# Patient Record
Sex: Female | Born: 1975 | Race: Black or African American | Hispanic: No | Marital: Single | Smoking: Current every day smoker
Health system: Southern US, Community
[De-identification: ages and names within clinical notes are randomized; demographics above are authoritative.]

## PROBLEM LIST (undated history)

## (undated) DIAGNOSIS — M1990 Unspecified osteoarthritis, unspecified site: Secondary | ICD-10-CM

---

## 2018-02-01 ENCOUNTER — Emergency Department (HOSPITAL_COMMUNITY): Payer: No Typology Code available for payment source

## 2018-02-01 ENCOUNTER — Other Ambulatory Visit: Payer: Self-pay

## 2018-02-01 ENCOUNTER — Emergency Department (HOSPITAL_COMMUNITY)
Admission: EM | Admit: 2018-02-01 | Discharge: 2018-02-01 | Disposition: A | Discharge status: Home / Self Care | Payer: No Typology Code available for payment source | Attending: Emergency Medicine | Admitting: Emergency Medicine

## 2018-02-01 ENCOUNTER — Encounter (HOSPITAL_COMMUNITY): Payer: Self-pay

## 2018-02-01 DIAGNOSIS — Y92414 Local residential or business street as the place of occurrence of the external cause: Secondary | ICD-10-CM | POA: Insufficient documentation

## 2018-02-01 DIAGNOSIS — Y9301 Activity, walking, marching and hiking: Secondary | ICD-10-CM | POA: Insufficient documentation

## 2018-02-01 DIAGNOSIS — S7012XA Contusion of left thigh, initial encounter: Secondary | ICD-10-CM | POA: Diagnosis not present

## 2018-02-01 DIAGNOSIS — S79922A Unspecified injury of left thigh, initial encounter: Secondary | ICD-10-CM | POA: Diagnosis present

## 2018-02-01 DIAGNOSIS — Z590 Homelessness: Secondary | ICD-10-CM | POA: Insufficient documentation

## 2018-02-01 DIAGNOSIS — Y999 Unspecified external cause status: Secondary | ICD-10-CM | POA: Diagnosis not present

## 2018-02-01 DIAGNOSIS — M25552 Pain in left hip: Secondary | ICD-10-CM | POA: Diagnosis not present

## 2018-02-01 HISTORY — DX: Unspecified osteoarthritis, unspecified site: M19.90

## 2018-02-01 LAB — I-STAT BETA HCG BLOOD, ED (MC, WL, AP ONLY): I-stat hCG, quantitative: <5 m[IU]/mL (ref <5)

## 2018-02-01 MED ORDER — IBUPROFEN 200 MG PO TABS
600.0000 mg | ORAL_TABLET | Freq: Once | ORAL | Status: AC
  Administered 2018-02-01: 600 mg via ORAL
  Filled 2018-02-01: qty 1

## 2018-02-01 MED ORDER — IBUPROFEN 800 MG PO TABS: 800.0000 mg | ORAL_TABLET | Freq: Three times a day (TID) | ORAL | 0 refills | Status: DC | PRN

## 2018-02-01 NOTE — ED Notes (Signed)
Pt taken to xray 
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
## 2018-02-01 NOTE — ED Notes (Signed)
Pt back from X-ray.  
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
## 2018-02-01 NOTE — Progress Notes (Signed)
Orthopedic Tech Progress Note Patient Details:  Kathleen Burgess May 14, 1976 [id number]  Ortho Devices Type of Ortho Device: Crutches Ortho Device/Splint Interventions: Ordered, Application, Adjustment   Post Interventions Patient Tolerated: Well Instructions Provided: Care of device, Adjustment of device   Trinna PostMartinez, Uzziah Rigg J 02/01/2018, 11:47 PM

## 2018-02-01 NOTE — ED Notes (Signed)
Pt moved into PodA room to change into scrub pants.

## 2018-02-01 NOTE — ED Notes (Signed)
Case Worker at bedside

## 2018-02-01 NOTE — ED Notes (Signed)
Ortho paged for crutches 
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
## 2018-02-01 NOTE — ED Notes (Signed)
Pt verbalized understanding of d/c instructions and crutch use and has no further questions. Pt wheeled to waiting room by this Rn to wait for bus in the AM. Social work provided pt with homeless shelter information and resources throughout [place] area as well as 2 bus passes. VSS, NAD.
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
## 2018-02-02 MED FILL — IBUPROFEN 800 MG TAB: 800 | 7 days supply | Qty: 21 | Fill #0

## 2018-03-11 ENCOUNTER — Encounter (HOSPITAL_COMMUNITY): Payer: Self-pay | Admitting: Emergency Medicine

## 2018-03-11 ENCOUNTER — Ambulatory Visit (HOSPITAL_COMMUNITY)
Admission: EM | Admit: 2018-03-11 | Discharge: 2018-03-11 | Disposition: A | Discharge status: Home / Self Care | Payer: Medicare Other | Attending: Internal Medicine | Admitting: Internal Medicine

## 2018-03-11 DIAGNOSIS — M25552 Pain in left hip: Secondary | ICD-10-CM

## 2018-03-11 DIAGNOSIS — M25562 Pain in left knee: Secondary | ICD-10-CM

## 2018-03-11 MED ORDER — KETOROLAC TROMETHAMINE 30 MG/ML IJ SOLN
INTRAMUSCULAR | Status: AC
Start: 2018-03-11 — End: ?
  Filled 2018-03-11: qty 1

## 2018-03-11 MED ORDER — MELOXICAM 7.5 MG PO TABS: 7.5000 mg | ORAL_TABLET | Freq: Every day | ORAL | 0 refills | Status: AC

## 2018-03-11 MED ORDER — KETOROLAC TROMETHAMINE 30 MG/ML IJ SOLN
30.0000 mg | Freq: Once | INTRAMUSCULAR | Status: AC
  Administered 2018-03-11: 30 mg via INTRAMUSCULAR
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
## 2018-03-11 NOTE — Discharge Instructions (Signed)
Toradol injection in office today.  I have refilled her Mobic for 30 days.  Start taking today as directed.  Ice compress to the hip and knees.  Follow-up with PCP for further evaluation and management.  If experiencing numbness and tingling of the inner thigh, loss of bladder or bowel control, go to the emergency department for further evaluation.
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
## 2018-04-02 ENCOUNTER — Other Ambulatory Visit: Payer: Self-pay

## 2018-04-02 ENCOUNTER — Encounter (HOSPITAL_COMMUNITY): Payer: Self-pay | Admitting: Emergency Medicine

## 2018-04-02 ENCOUNTER — Emergency Department (HOSPITAL_COMMUNITY)
Admission: EM | Admit: 2018-04-02 | Discharge: 2018-04-03 | Disposition: A | Discharge status: Home / Self Care | Payer: Medicare Other | Attending: Emergency Medicine | Admitting: Emergency Medicine

## 2018-04-02 DIAGNOSIS — F1414 Cocaine abuse with cocaine-induced mood disorder: Secondary | ICD-10-CM | POA: Insufficient documentation

## 2018-04-02 DIAGNOSIS — F1410 Cocaine abuse, uncomplicated: Secondary | ICD-10-CM | POA: Diagnosis not present

## 2018-04-02 DIAGNOSIS — Z79899 Other long term (current) drug therapy: Secondary | ICD-10-CM | POA: Diagnosis not present

## 2018-04-02 DIAGNOSIS — F17210 Nicotine dependence, cigarettes, uncomplicated: Secondary | ICD-10-CM | POA: Diagnosis not present

## 2018-04-02 DIAGNOSIS — F332 Major depressive disorder, recurrent severe without psychotic features: Secondary | ICD-10-CM | POA: Diagnosis not present

## 2018-04-02 DIAGNOSIS — F32A Depression, unspecified: Secondary | ICD-10-CM

## 2018-04-02 DIAGNOSIS — Z008 Encounter for other general examination: Secondary | ICD-10-CM | POA: Diagnosis present

## 2018-04-02 DIAGNOSIS — F329 Major depressive disorder, single episode, unspecified: Secondary | ICD-10-CM

## 2018-04-02 LAB — BASIC METABOLIC PANEL
Anion gap: 13 (ref 5–15)
BUN: 20 mg/dL (ref 6–20)
CALCIUM: 9.2 mg/dL (ref 8.9–10.3)
CO2: 24 mmol/L (ref 22–32)
CREATININE: 1.08 mg/dL — AB (ref 0.44–1.00)
Chloride: 103 mmol/L (ref 98–111)
GFR calc non Af Amer: >60 mL/min (ref >60)
Glucose, Bld: 83 mg/dL (ref 70–99)
Potassium: 3.7 mmol/L (ref 3.5–5.1)
SODIUM: 140 mmol/L (ref 135–145)

## 2018-04-02 LAB — CBC WITH DIFFERENTIAL/PLATELET
BASOS PCT: 0 %
Basophils Absolute: 0 10*3/uL (ref 0.0–0.1)
EOS PCT: 3 %
Eosinophils Absolute: 0.2 10*3/uL (ref 0.0–0.7)
HCT: 37.1 % (ref 36.0–46.0)
Hemoglobin: 12.1 g/dL (ref 12.0–15.0)
Lymphocytes Relative: 50 %
Lymphs Abs: 3.8 10*3/uL (ref 0.7–4.0)
MCH: 28.1 pg (ref 26.0–34.0)
MCHC: 32.6 g/dL (ref 30.0–36.0)
MCV: 86.1 fL (ref 78.0–100.0)
MONO ABS: 0.5 10*3/uL (ref 0.1–1.0)
MONOS PCT: 6 %
Neutro Abs: 3.1 10*3/uL (ref 1.7–7.7)
Neutrophils Relative %: 41 %
Platelets: 304 10*3/uL (ref 150–400)
RBC: 4.31 MIL/uL (ref 3.87–5.11)
RDW: 14.3 % (ref 11.5–15.5)
WBC: 7.5 10*3/uL (ref 4.0–10.5)

## 2018-04-02 LAB — ETHANOL

## 2018-04-02 LAB — URINALYSIS, ROUTINE W REFLEX MICROSCOPIC
Bilirubin Urine: NEGATIVE
Glucose, UA: NEGATIVE mg/dL
Ketones, ur: NEGATIVE mg/dL
Leukocytes, UA: NEGATIVE
Nitrite: NEGATIVE
Protein, ur: NEGATIVE mg/dL
SPECIFIC GRAVITY, URINE: 1.005 (ref 1.005–1.030)
pH: 6 (ref 5.0–8.0)

## 2018-04-02 LAB — RAPID URINE DRUG SCREEN, HOSP PERFORMED
AMPHETAMINES: NOT DETECTED
Barbiturates: NOT DETECTED
Benzodiazepines: NOT DETECTED
Cocaine: POSITIVE — AB
OPIATES: NOT DETECTED
TETRAHYDROCANNABINOL: NOT DETECTED

## 2018-04-02 LAB — ACETAMINOPHEN LEVEL

## 2018-04-02 LAB — SALICYLATE LEVEL: Salicylate Lvl: <7 mg/dL (ref 2.8–30.0)

## 2018-04-02 MED ORDER — ONDANSETRON HCL 4 MG PO TABS: 4.0000 mg | ORAL_TABLET | Freq: Three times a day (TID) | ORAL | Status: DC | PRN

## 2018-04-02 MED ORDER — HALOPERIDOL 5 MG PO TABS
5.0000 mg | ORAL_TABLET | Freq: Two times a day (BID) | ORAL | Status: DC
  Filled 2018-04-02 (×2): qty 1

## 2018-04-02 MED ORDER — ACETAMINOPHEN 325 MG PO TABS
650.0000 mg | ORAL_TABLET | ORAL | Status: DC | PRN
Start: 2018-04-02 — End: 2018-04-03
  Administered 2018-04-03: 650 mg via ORAL
  Filled 2018-04-02: qty 2

## 2018-04-02 MED ORDER — ALUM & MAG HYDROXIDE-SIMETH 200-200-20 MG/5ML PO SUSP: 30.0000 mL | Freq: Four times a day (QID) | ORAL | Status: DC | PRN

## 2018-04-02 MED ORDER — CARBAMAZEPINE 200 MG PO TABS
200.0000 mg | ORAL_TABLET | Freq: Two times a day (BID) | ORAL | Status: DC
  Filled 2018-04-02: qty 1

## 2018-04-02 MED ORDER — ZOLPIDEM TARTRATE 5 MG PO TABS: 5.0000 mg | ORAL_TABLET | Freq: Every evening | ORAL | Status: DC | PRN

## 2018-04-02 NOTE — ED Notes (Addendum)
Pt watching tv, eating snack.  Pt reports that she has a history of depression and has been off her medications for several years.  Pt reports that she was last on remeron.  Pt reports that she is here for suicidal thoughts with out a plan.  Pt reports that she did have a plan but "couldn't get to it..."  Pt reports that she does have a hx of previous suicide attempts.  Pt denies HI, but does report AVH.  Pt reports that it is intermittent, and was occurring last night when they brought her in.  Voices are "whispers" and that visual are "shadows.  Pt reports [place] and that things are not going well for her.  NAD, procedures explained, oriented to unit.

## 2018-04-02 NOTE — ED Notes (Signed)
Patient refused Haldol, stated she didn't want any medication to mess with her brain.

## 2018-04-02 NOTE — ED Notes (Signed)
Pt declined medication, is concerned about the dose(too high) and wants to wait until she she's the MD in the morning before taking.

## 2018-04-02 NOTE — ED Notes (Signed)
Pt ambulatory w/o difficulty to room 34 

## 2018-04-02 NOTE — ED Notes (Addendum)
Belongings: Pt stated that she didn't like for people going through her things so she refused to sign. Pt also stated she had a letter and two other wallets that staff didn't see. Staff explained to the pt that staff didn't see the wallets and letter, but documented what was seen. Pt refused to sign.  Staff documented  Slide in shoes, blue jeans, black book bag, cigarettes, lighters (2), ear buds, cell phone, black wallet, [place] Master Card, Visa debit cards (2), EBT card [id number], NCID (copy), chargers (2), and matchbook.

## 2018-04-02 NOTE — BH Assessment (Addendum)
Assessment Note  Kathleen Burgess is an 42 y.o. female that presents this date with S/I and a plan to overdose or run into traffic. Patient denies any H/I or AVH. Patient denies any SA issues although patient's UDS was positive for cocaine. Patient renders a limited history and displays active thought blocking. Patient reports she was diagnosed with depression over 10 years ago and has been "off and on" medications since then. Patient states she has not been on any medications for over two years and cannot recall who/where she received treatment from. Patient states she has recently relocated from [facility] where she resided with friends although that "didn't work out" and  relocated to [place] currently being homeless. Patient is oriented x 4 and speaks in a slow soft voice being drowsy. Patient has difficultly recalling her treatment history. Patient reports multiple attempts at self harm although can only recall one incident when she reported she overdosed on medications although will not elaborate on that incident or time frame. Per notes, "Patient presents with depression and suicidal ideation. Patient states that she has been depressed for several years, has been on various medications which have never seemed to help. Patient states that [place] [facility] [facility] and people are hateful which is why she would like to commit suicide today. Patient's plan is to either step out into traffic or to take all of her medications. Patient reports prior suicide attempt in which she took all of her medications and went to the emergency room and had her stomach pumped and took charcoal". Patient reports ongoing symptoms of depression to include: guilt, excessive fatigue and isolating. Case was staffed with Shaune PollackLord DNP who recommended patient be monitored and observed for safety. Patient will also be evaluated for possible medication interventions to assist with stabilization. Patient will be seen by  psychiatry in the a.m.     Diagnosis: F33.2 MDD recurrent without psychotic features, severe   Past Medical History:  Past Medical History:  Diagnosis Date  . Arthritis     History reviewed. No pertinent surgical history.  Family History: No family history on file.  Social History:  reports that she has been smoking cigarettes. She has been smoking about 1.00 pack per day. She does not have any smokeless tobacco history on file. She reports that she drinks alcohol. She reports that she has current or past drug history. Drug: Cocaine.  Additional Social History:  Alcohol / Drug Use Pain Medications: See MAR Prescriptions: See MAR Over the Counter: See MAR History of alcohol / drug use?: No history of alcohol / drug abuse Longest period of sobriety (when/how long): NA Negative Consequences of Use: (Denies) Withdrawal Symptoms: (Denies)  CIWA:   COWS:    Allergies:  Allergies  Allergen Reactions  . Contrast Media [Iodinated Diagnostic Agents] Nausea And Vomiting    Home Medications:  (Not in a hospital admission)  OB/GYN Status:  No LMP recorded. (Menstrual status: Irregular Periods).  General Assessment Data [facility] of Assessment: [facility] Assessment: In system Is this a Tele or Face-to-Face Assessment?: Face-to-Face Is this an Initial Assessment or a Re-assessment for this encounter?: Initial Assessment Marital status: Single Maiden name: NA Is patient pregnant?: No Pregnancy Status: No Living Arrangements: Alone Can pt return to current living arrangement?: Yes Admission Status: Voluntary Is patient capable of signing voluntary admission?: Yes Referral Source: Self/Family/Friend Insurance type: Medicare  Medical Screening Exam [facility] Walk-in ONLY) Medical Exam completed: Yes  Crisis Care Plan Living  Arrangements: Alone Legal Guardian: (NA) Name of Psychiatrist: None Name of Therapist: None  Education Status Is patient currently in school?: No Is the  patient employed, unemployed or receiving disability?: Unemployed  Risk to self with the past 6 months Suicidal Ideation: Yes-Currently Present Has patient been a risk to self within the past 6 months prior to admission? : No Suicidal Intent: Yes-Currently Present Has patient had any suicidal intent within the past 6 months prior to admission? : No Is patient at risk for suicide?: Yes Suicidal Plan?: Yes-Currently Present Has patient had any suicidal plan within the past 6 months prior to admission? : No Specify Current Suicidal Plan: Overdose or run into traffic Access to Means: Yes Specify Access to Suicidal Means: Pt has medicaions  What has been your use of drugs/alcohol within the last 12 months?: Current use Previous Attempts/Gestures: Yes How many times?: (Multiple per patient ) Other Self Harm Risks: (Off medications ) Triggers for Past Attempts: Unknown Intentional Self Injurious Behavior: None Family Suicide History: No Recent stressful life event(s): Other (Comment)(Homeless) Persecutory voices/beliefs?: No Depression: Yes Depression Symptoms: Feeling angry/irritable Substance abuse history and/or treatment for substance abuse?: No Suicide prevention information given to non-admitted patients: Not applicable  Risk to Others within the past 6 months Homicidal Ideation: No Does patient have any lifetime risk of violence toward others beyond the six months prior to admission? : No Thoughts of Harm to Others: No Current Homicidal Intent: No Current Homicidal Plan: No Access to Homicidal Means: No Identified Victim: NA History of harm to others?: No Assessment of Violence: None Noted Violent Behavior Description: NA Does patient have access to weapons?: No Criminal Charges Pending?: No Does patient have a court date: No Is patient on probation?: No  Psychosis Hallucinations: None noted Delusions: None noted  Mental Status Report Appearance/Hygiene: In scrubs Eye  Contact: Fair Motor Activity: Unremarkable Speech: Soft, Slow Level of Consciousness: Drowsy Mood: Depressed Affect: Sullen Anxiety Level: Minimal Thought Processes: Thought Blocking Judgement: Partial Orientation: Person, Place, Time Obsessive Compulsive Thoughts/Behaviors: None  Cognitive Functioning Concentration: Decreased Memory: Recent Intact, Remote Intact Is patient IDD: No Is patient DD?: No Insight: Fair Impulse Control: Poor Appetite: Fair Have you had any weight changes? : No Change Sleep: No Change Total Hours of Sleep: 6 Vegetative Symptoms: None  ADLScreening [facility]) Patient's cognitive ability adequate to safely complete daily activities?: Yes Patient able to express need for assistance with ADLs?: Yes Independently performs ADLs?: Yes (appropriate for developmental age)  Prior Inpatient Therapy Prior Inpatient Therapy: Yes Prior Therapy Dates: 2018 Prior Therapy Facilty/Provider(s): [facility] Reason for Treatment: MH issues  Prior Outpatient Therapy Prior Outpatient Therapy: Yes Prior Therapy Dates: 2017 Prior Therapy Facilty/Provider(s): [facility] Reason for Treatment: Med mang Does patient have an ACCT team?: No Does patient have Intensive In-House Services?  : No Does patient have [facility] services? : No Does patient have P4CC services?: No  ADL Screening (condition at time of admission) Patient's cognitive ability adequate to safely complete daily activities?: Yes Is the patient deaf or have difficulty hearing?: No Does the patient have difficulty seeing, even when wearing glasses/contacts?: No Does the patient have difficulty concentrating, remembering, or making decisions?: No Patient able to express need for assistance with ADLs?: Yes Does the patient have difficulty dressing or bathing?: No Independently performs ADLs?: Yes (appropriate for developmental age) Does the patient have difficulty walking or climbing stairs?:  No Weakness of Legs: None Weakness of Arms/Hands: None  Home Assistive Devices/Equipment Home Assistive  Devices/Equipment: None  Therapy Consults (therapy consults require a physician order) PT Evaluation Needed: No OT Evalulation Needed: No SLP Evaluation Needed: No Abuse/Neglect Assessment (Assessment to be complete while patient is alone) Physical Abuse: Denies Verbal Abuse: Denies Sexual Abuse: Denies Exploitation of patient/patient's resources: Denies Self-Neglect: Denies Values / Beliefs Cultural Requests During Hospitalization: None Spiritual Requests During Hospitalization: None Consults Spiritual Care Consult Needed: No Social Work Consult Needed: No Merchant navy officer (For Healthcare) Does Patient Have a Medical Advance Directive?: No Would patient like information on creating a medical advance directive?: No - Patient declined    Additional Information 1:1 In Past 12 Months?: No CIRT Risk: No Elopement Risk: No Does patient have medical clearance?: Yes     Disposition: Case was staffed with Shaune Pollack DNP who recommended patient be monitored and observed for safety. Patient will also be evaluated for possible medication interventions to assist with stabilization. Patient will be seen by psychiatry in the a.m. Disposition Initial Assessment Completed for this Encounter: Yes Disposition of Patient: (Observe and monitor) Patient refused recommended treatment: No Mode of transportation if patient is discharged?: (Unk)  On Site Evaluation by:   Reviewed with Physician:    Alfredia Ferguson 04/02/2018 11:31 AM

## 2018-04-02 NOTE — ED Notes (Signed)
Pt called for triage with no answer from the lobby, bathroom, or out in the front.

## 2018-04-02 NOTE — ED Notes (Signed)
Pt refused PO medication, stating that she would prefer to speak with the physician before taking any medication.

## 2018-04-02 NOTE — ED Provider Notes (Signed)
[facility]-EMERGENCY DEPT Provider Note   CSN: [id number] Arrival date & time: 04/02/18  [id number]     History   Chief Complaint Chief Complaint  Patient presents with  . Medical Clearance    HPI Kathleen Burgess is a 42 y.o. female.  42 year old female presents with complaint of depression and suicidal ideation.  Patient states that she has been depressed for several years, has been on various medications which have never seemed to help.  Patient states that she [place] [place] and people are hateful which is why she would like to commit suicide today.  Patient's plan is to either step out into traffic or to take all of her medications.  Patient reports prior suicide attempt in which she took all of her medications and went to the emergency room and had her stomach pumped and took charcoal.  Patient also reports auditory hallucinations, does not specify.  States that she is homicidal towards "hateful people."  Patient reports arthritis in her left knee which she takes meloxicam for and states she believes she missed her dose yesterday otherwise denies any other medical complaints today.     Past Medical History:  Diagnosis Date  . Arthritis     Patient Active Problem List   Diagnosis Date Noted  . Cocaine abuse with cocaine-induced mood disorder (HCC) 04/02/2018    History reviewed. No pertinent surgical history.   OB History   None      Home Medications    Prior to Admission medications   Medication Sig Start Date End Date Taking? Authorizing Provider  ibuprofen (ADVIL,MOTRIN) 200 MG tablet Take 400-800 mg by mouth every 6 (six) hours as needed for moderate pain.   Yes [provider]  meloxicam (MOBIC) 7.5 MG tablet Take 1 tablet (7.5 mg total) by mouth daily. 03/11/18  Yes Yu, Amy V, PA-C  naproxen sodium (ALEVE) 220 MG tablet Take 440 mg by mouth 2 (two) times daily as needed (pain).   Yes [provider]      Family History No family history on file.  Social History Social History   Tobacco Use  . Smoking status: Current Every Day Smoker    Packs/day: 1.00    Types: Cigarettes  Substance Use Topics  . Alcohol use: Yes    Frequency: Never    Comment: every day  . Drug use: Yes    Types: Cocaine    Comment: last use yesterday 02/01/18     Allergies   Contrast media [iodinated diagnostic agents]   Review of Systems Review of Systems  Constitutional: Negative for chills and fever.  Respiratory: Negative for shortness of breath.   Cardiovascular: Negative for chest pain.  Gastrointestinal: Negative for abdominal pain, constipation, diarrhea, nausea and vomiting.  Genitourinary: Negative for difficulty urinating, dysuria and frequency.  Musculoskeletal: Positive for arthralgias. Negative for myalgias.  Skin: Negative for rash and wound.  Allergic/Immunologic: Negative for immunocompromised state.  Neurological: Negative for weakness and headaches.  Hematological: Does not bruise/bleed easily.  Psychiatric/Behavioral: Positive for hallucinations and suicidal ideas. Negative for confusion.  All other systems reviewed and are negative.    Physical Exam Updated Vital Signs BP 123/66 (BP Location: Right Arm)   Pulse 68   Temp 98.7 F (37.1 C) (Oral)   Resp 18   SpO2 100%   Physical Exam  Constitutional: She is oriented to person, place, and time. She appears well-developed and well-nourished. No distress.  HENT:  Head: Normocephalic  and atraumatic.  Eyes: Right conjunctiva is injected. Left conjunctiva is injected.  Neck: Neck supple.  Cardiovascular: Normal rate, regular rhythm, normal heart sounds and intact distal pulses.  No murmur heard. Pulmonary/Chest: Effort normal and breath sounds normal. No respiratory distress.  Abdominal: Soft. She exhibits no distension. There is no tenderness.  Neurological: She is alert and oriented to person, place, and time.  Skin:  Skin is warm and dry. No rash noted. She is not diaphoretic.  Psychiatric: Her speech is delayed. She is withdrawn. She is not actively hallucinating. Thought content is not paranoid. Cognition and memory are normal. She exhibits a depressed mood. She expresses homicidal and suicidal ideation. She expresses suicidal plans. She expresses no homicidal plans. She is attentive.  Nursing note and vitals reviewed.    ED Treatments / Results  Labs (all labs ordered are listed, but only abnormal results are displayed) Labs Reviewed  URINALYSIS, ROUTINE W REFLEX MICROSCOPIC - Abnormal; Notable for the following components:      Result Value   Hgb urine dipstick LARGE (*)    Bacteria, UA RARE (*)    All other components within normal limits  RAPID URINE DRUG SCREEN, HOSP PERFORMED - Abnormal; Notable for the following components:   Cocaine POSITIVE (*)    All other components within normal limits  BASIC METABOLIC PANEL - Abnormal; Notable for the following components:   Creatinine, Ser 1.08 (*)    All other components within normal limits  ACETAMINOPHEN LEVEL - Abnormal; Notable for the following components:   Acetaminophen (Tylenol), Serum <10 (*)    All other components within normal limits  CBC WITH DIFFERENTIAL/PLATELET  ETHANOL  SALICYLATE LEVEL    EKG None  Radiology No results found.  Procedures Procedures (including critical care time)  Medications Ordered in ED Medications  acetaminophen (TYLENOL) tablet 650 mg (has no administration in time range)  ondansetron (ZOFRAN) tablet 4 mg (has no administration in time range)  alum & mag hydroxide-simeth (MAALOX/MYLANTA) 200-200-20 MG/5ML suspension 30 mL (has no administration in time range)  haloperidol (HALDOL) tablet 5 mg (0 mg Oral Not Given 04/02/18 1235)  carbamazepine (TEGRETOL) tablet 200 mg (has no administration in time range)     Initial Impression / Assessment and Plan / ED Course  I have reviewed the triage vital  signs and the nursing notes.  Pertinent labs & imaging results that were available during my care of the patient were reviewed by me and considered in my medical decision making (see chart for details).  Clinical Course as of Apr 03 1339  Sat Apr 02, 2018  [id number] 42 year old female with history of depression presents with suicidal ideation with plan.  Patient has been medically cleared for behavioral health evaluation.   [LM]    Clinical Course User Index [LM] Jeannie Fend, PA-C     Final Clinical Impressions(s) / ED Diagnoses   Final diagnoses:  Depression, unspecified depression type  Cocaine abuse [facility])  Suicidal ideation    ED Discharge Orders    None       Alden Hipp 04/02/18 1340    Donnetta Hutching, MD 04/03/18 [phone]

## 2018-04-02 NOTE — ED Triage Notes (Signed)
Pt arriving with depression and thoughts of suicide. Pt states "everyone is just so mean and nasty here in [place], I'm just tired of dealing with it". Pt admits that her plan for suicide would be to take several of her medications or jump in front of a car.

## 2018-04-02 NOTE — ED Notes (Signed)
SBAR Report received from previous nurse. Pt received asleep on unit and unable to participate in assessment of current SI/ HI, A/V H, depression, anxiety, or pain at this time, but appears otherwise stable and free of distress. Pt reminded of camera surveillance, q 15 min rounds, and rules of the milieu. Will continue to assess.

## 2018-04-02 NOTE — ED Notes (Signed)
Up tot he bathroom to shower and change scrubs 

## 2018-04-02 NOTE — BH Assessment (Signed)
[facility] Assessment Progress Note  Case was staffed with Lord DNP who recommended patient be monitored and observed for safety. Patient will also be evaluated for possible medication interventions to assist with stabilization. Patient will be seen by psychiatry in the a.m.      

## 2018-04-03 DIAGNOSIS — F332 Major depressive disorder, recurrent severe without psychotic features: Secondary | ICD-10-CM | POA: Diagnosis not present

## 2018-04-03 DIAGNOSIS — F1414 Cocaine abuse with cocaine-induced mood disorder: Secondary | ICD-10-CM | POA: Diagnosis not present

## 2018-04-03 MED ORDER — CARBAMAZEPINE 200 MG PO TABS: 100.0000 mg | ORAL_TABLET | Freq: Two times a day (BID) | ORAL | 0 refills | Status: AC

## 2018-04-03 MED ORDER — NICOTINE 14 MG/24HR TD PT24
14.0000 mg | MEDICATED_PATCH | Freq: Once | TRANSDERMAL | Status: DC
  Administered 2018-04-03: 14 mg via TRANSDERMAL
  Filled 2018-04-03: qty 1
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
## 2018-04-03 NOTE — Consult Note (Addendum)
[facility] ED Discharge  04/03/2018 10:29 AM Kathleen Burgess  MRN:  [id number] Principal Problem: Cocaine abuse with cocaine-induced mood disorder [facility]) Discharge Diagnoses:  Patient Active Problem List   Diagnosis Date Noted  . Cocaine abuse with cocaine-induced mood disorder [facility]) [F14.14] 04/02/2018    Priority: High    Subjective: 42 yo female who presented to the ED after using cocaine and having suicidal ideations.  Her depression makes her substance abuse worse and having shelter improves it.  She slept most of the day yesterday and today.  On assessment, she denies suicidal/homicidal ideations,hallucintions, or withdrawal symptoms.  Peer support met with her but she did not want rehab or recovery, stable for discharge.  Total Time spent with patient: 45 minutes  Past Psychiatric History: substance abuse, depression, anxiety  Past Medical History:  Past Medical History:  Diagnosis Date  . Arthritis    History reviewed. No pertinent surgical history. Family History: No family history on file. Family Psychiatric  History: none Social History:  Social History   Substance and Sexual Activity  Alcohol Use Yes  . Frequency: Never   Comment: every day    Social History   Substance and Sexual Activity  Drug Use Yes  . Types: Cocaine   Comment: last use yesterday 02/01/18   Social History   Socioeconomic History  . Marital status: Single    Spouse name: Not on file  . Number of children: Not on file  . Years of education: Not on file  . Highest education level: Not on file  Occupational History  . Not on file  Social Needs  . Financial resource strain: Not on file  . Food insecurity:    Worry: Not on file    Inability: Not on file  . Transportation needs:    Medical: Not on file    Non-medical: Not on file  Tobacco Use  . Smoking status: Current Every Day Smoker    Packs/day: 1.00    Types: Cigarettes  Substance and Sexual Activity  . Alcohol use: Yes     Frequency: Never    Comment: every day  . Drug use: Yes    Types: Cocaine    Comment: last use yesterday 02/01/18  . Sexual activity: Not on file  Lifestyle  . Physical activity:    Days per week: Not on file    Minutes per session: Not on file  . Stress: Not on file  Relationships  . Social connections:    Talks on phone: Not on file    Gets together: Not on file    Attends religious service: Not on file    Active member of club or organization: Not on file    Attends meetings of clubs or organizations: Not on file    Relationship status: Not on file  Other Topics Concern  . Not on file  Social History Narrative  . Not on file    Has this patient used any form of tobacco in the last 30 days? (Cigarettes, Smokeless Tobacco, Cigars, and/or Pipes) A prescription for an FDA-approved tobacco cessation medication was offered at discharge and the patient refused  Current Medications: Current Facility-Administered Medications  Medication Dose Route Frequency Provider Last Rate Last Dose  . acetaminophen (TYLENOL) tablet 650 mg  650 mg Oral Q4H PRN Tacy Learn, PA-C   650 mg at 04/03/18 0736  . alum & mag hydroxide-simeth (MAALOX/MYLANTA) 200-200-20 MG/5ML suspension 30 mL  30 mL Oral Q6H PRN Tacy Learn,  PA-C      . carbamazepine (TEGRETOL) tablet 200 mg  200 mg Oral BID PC Tellis Spivak, MD      . haloperidol (HALDOL) tablet 5 mg  5 mg Oral BID Waylan Boga Y, NP      . nicotine (NICODERM CQ - dosed in mg/24 hours) patch 14 mg  14 mg Transdermal Once Patrecia Pour, NP   14 mg at 04/03/18 0745  . ondansetron (ZOFRAN) tablet 4 mg  4 mg Oral Q8H PRN Tacy Learn, PA-C       Current Outpatient Medications  Medication Sig Dispense Refill  . ibuprofen (ADVIL,MOTRIN) 200 MG tablet Take 400-800 mg by mouth every 6 (six) hours as needed for moderate pain.    . meloxicam (MOBIC) 7.5 MG tablet Take 1 tablet (7.5 mg total) by mouth daily. 30 tablet 0  . naproxen sodium (ALEVE)  220 MG tablet Take 440 mg by mouth 2 (two) times daily as needed (pain).     PTA Medications:  (Not in a hospital admission)  Musculoskeletal: Strength & Muscle Tone: within normal limits Gait & Station: normal Patient leans: N/A  Psychiatric Specialty Exam: Physical Exam  Nursing note and vitals reviewed. Constitutional: She is oriented to person, place, and time. She appears well-developed and well-nourished.  HENT:  Head: Normocephalic.  Neck: Normal range of motion.  Respiratory: Effort normal.  Musculoskeletal: Normal range of motion.  Neurological: She is alert and oriented to person, place, and time.  Psychiatric: Her speech is normal and behavior is normal. Judgment and thought content normal. Her mood appears anxious. Her affect is blunt. Cognition and memory are normal. She exhibits a depressed mood.    Review of Systems  Psychiatric/Behavioral: Positive for depression and substance abuse. The patient is nervous/anxious.   All other systems reviewed and are negative.   Blood pressure (!) 146/74, pulse 67, temperature 98.7 F (37.1 C), temperature source Oral, resp. rate 18, SpO2 98 %.There is no height or weight on file to calculate BMI.  General Appearance: Casual  Eye Contact:  Good  Speech:  Normal Rate  Volume:  Normal  Mood:  Anxious and Depressed, mild  Affect:  Congruent  Thought Process:  Coherent and Descriptions of Associations: Intact  Orientation:  Full (Time, Place, and Person)  Thought Content:  WDL and Logical  Suicidal Thoughts:  No  Homicidal Thoughts:  No  Memory:  Immediate;   Good Recent;   Good Remote;   Good  Judgement:  Fair  Insight:  Fair  Psychomotor Activity:  Normal  Concentration:  Concentration: Good and Attention Span: Good  Recall:  [employer] of Knowledge:  Fair  Language:  Good  Akathisia:  No  Handed:  Right  AIMS (if indicated):     Assets:  Leisure Time Physical Health Resilience  ADL's:  Intact  Cognition:  WNL   Sleep:        Demographic Factors:  NA  Loss Factors: Financial problems/change in socioeconomic status  Historical Factors: NA  Risk Reduction Factors:   Sense of responsibility to family and Positive social support  Continued Clinical Symptoms:  Depression and anxiety, mild  Cognitive Features That Contribute To Risk:  None    Suicide Risk:  Minimal: No identifiable suicidal ideation.  Patients presenting with no risk factors but with morbid ruminations; may be classified as minimal risk based on the severity of the depressive symptoms    Plan Of Care/Follow-up recommendations:  Activity:  as tolerated  Diet:  heart healthy diet  Disposition: discharge home Waylan Boga, NP 04/03/2018, 10:29 AM  Patient seen face-to-face for psychiatric evaluation, chart reviewed and case discussed with the physician extender and developed treatment plan. Reviewed the information documented and agree with the treatment plan. Corena Pilgrim, MD

## 2018-04-03 NOTE — ED Notes (Signed)
Pt discharged home. Discharged instructions given to pt with a bus pass. Pt was irritable about being discharged. States that [place] "[place].  All belongings returned to pt who refused to sign for them. Denied SI/HI this morning, is not delusional and not responding to internal stimuli. Escorted pt to the ED exit.

## 2018-04-03 NOTE — ED Notes (Signed)
Pt refused counseling from Peer Support.
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
